# Patient Record
Sex: Male | Born: 1990 | Race: Black or African American | Hispanic: No | Marital: Single | Smoking: Never smoker
Health system: Southern US, Community
[De-identification: ages and names within clinical notes are randomized; demographics above are authoritative.]

## PROBLEM LIST (undated history)

## (undated) DIAGNOSIS — S0181XA Laceration without foreign body of other part of head, initial encounter: Secondary | ICD-10-CM

## (undated) HISTORY — DX: Laceration without foreign body of other part of head, initial encounter: S01.81XA

---

## 2011-06-24 ENCOUNTER — Emergency Department (HOSPITAL_COMMUNITY): Payer: Self-pay

## 2011-06-24 ENCOUNTER — Emergency Department (HOSPITAL_COMMUNITY)
Admission: EM | Admit: 2011-06-24 | Discharge: 2011-06-24 | Disposition: A | Discharge status: Hospice | Payer: Self-pay | Attending: Emergency Medicine | Admitting: Emergency Medicine

## 2011-06-24 ENCOUNTER — Inpatient Hospital Stay (HOSPITAL_COMMUNITY)
Admission: EM | Admit: 2011-06-24 | Discharge: 2011-06-27 | DRG: 699 | Disposition: A | Discharge status: Home / Self Care | Payer: PRIVATE HEALTH INSURANCE | Source: Ambulatory Visit | Attending: General Surgery | Admitting: General Surgery

## 2011-06-24 DIAGNOSIS — S0180XA Unspecified open wound of other part of head, initial encounter: Secondary | ICD-10-CM

## 2011-06-24 DIAGNOSIS — S36899A Unspecified injury of other intra-abdominal organs, initial encounter: Secondary | ICD-10-CM | POA: Insufficient documentation

## 2011-06-24 DIAGNOSIS — N289 Disorder of kidney and ureter, unspecified: Secondary | ICD-10-CM | POA: Diagnosis present

## 2011-06-24 DIAGNOSIS — Y9302 Activity, running: Secondary | ICD-10-CM | POA: Insufficient documentation

## 2011-06-24 DIAGNOSIS — Y9210 Unspecified residential institution as the place of occurrence of the external cause: Secondary | ICD-10-CM | POA: Diagnosis present

## 2011-06-24 DIAGNOSIS — S37009A Unspecified injury of unspecified kidney, initial encounter: Secondary | ICD-10-CM | POA: Insufficient documentation

## 2011-06-24 DIAGNOSIS — W108XXA Fall (on) (from) other stairs and steps, initial encounter: Secondary | ICD-10-CM | POA: Diagnosis present

## 2011-06-24 DIAGNOSIS — W2209XA Striking against other stationary object, initial encounter: Secondary | ICD-10-CM | POA: Diagnosis present

## 2011-06-24 DIAGNOSIS — D62 Acute posthemorrhagic anemia: Secondary | ICD-10-CM | POA: Diagnosis present

## 2011-06-24 DIAGNOSIS — S37039A Laceration of unspecified kidney, unspecified degree, initial encounter: Principal | ICD-10-CM | POA: Diagnosis present

## 2011-06-24 LAB — LACTIC ACID, PLASMA: Lactic Acid, Venous: 4 mmol/L — ABNORMAL HIGH (ref 0.5–2.2)

## 2011-06-24 LAB — RAPID URINE DRUG SCREEN, HOSP PERFORMED
Amphetamines: NOT DETECTED
Benzodiazepines: NOT DETECTED
Tetrahydrocannabinol: NOT DETECTED

## 2011-06-24 LAB — CBC
HCT: 41.7 % (ref 39.0–52.0)
HCT: 36.6 % — ABNORMAL LOW (ref 39.0–52.0)
HCT: 35.4 % — ABNORMAL LOW (ref 39.0–52.0)
MCH: 28.9 pg (ref 26.0–34.0)
MCH: 28.8 pg (ref 26.0–34.0)
MCHC: 34.5 g/dL (ref 30.0–36.0)
MCHC: 34.4 g/dL (ref 30.0–36.0)
MCHC: 34.5 g/dL (ref 30.0–36.0)
MCV: 83.7 fL (ref 78.0–100.0)
MCV: 83.1 fL (ref 78.0–100.0)
Platelets: 256 10*3/uL (ref 150–400)
RDW: 12.5 % (ref 11.5–15.5)
RDW: 12.5 % (ref 11.5–15.5)
RDW: 12.6 % (ref 11.5–15.5)
WBC: 11.8 10*3/uL — ABNORMAL HIGH (ref 4.0–10.5)

## 2011-06-24 LAB — ETHANOL: Alcohol, Ethyl (B): 46 mg/dL — ABNORMAL HIGH (ref 0–11)

## 2011-06-24 LAB — BASIC METABOLIC PANEL
BUN: 12 mg/dL (ref 6–23)
Calcium: 9.6 mg/dL (ref 8.4–10.5)
Creatinine, Ser: 1.46 mg/dL — ABNORMAL HIGH (ref 0.50–1.35)
GFR calc Af Amer: 79 mL/min — ABNORMAL LOW (ref >90)
GFR calc non Af Amer: 68 mL/min — ABNORMAL LOW (ref >90)
Glucose, Bld: 112 mg/dL — ABNORMAL HIGH (ref 70–99)
Potassium: 3.7 mEq/L (ref 3.5–5.1)

## 2011-06-24 LAB — URINALYSIS, ROUTINE W REFLEX MICROSCOPIC
Ketones, ur: 15 mg/dL — AB
Nitrite: NEGATIVE
Protein, ur: >300 mg/dL — AB
Urobilinogen, UA: 0.2 mg/dL (ref 0.0–1.0)

## 2011-06-24 LAB — DIFFERENTIAL
Basophils Absolute: 0 10*3/uL (ref 0.0–0.1)
Eosinophils Relative: 0 % (ref 0–5)
Lymphocytes Relative: 6 % — ABNORMAL LOW (ref 12–46)
Monocytes Absolute: 1.5 10*3/uL — ABNORMAL HIGH (ref 0.1–1.0)
Monocytes Relative: 8 % (ref 3–12)

## 2011-06-24 LAB — MRSA PCR SCREENING: MRSA by PCR: NEGATIVE

## 2011-06-24 MED ORDER — IOHEXOL 300 MG/ML  SOLN
100.0000 mL | Freq: Once | INTRAMUSCULAR | Status: AC | PRN
  Administered 2011-06-24: 100 mL via INTRAVENOUS

## 2011-06-24 NOTE — H&P (Signed)
NAME:  [name], [name]             ACCOUNT NO.:  [id number]  MEDICAL RECORD NO.:  [id number]  LOCATION:  [facility]                         FACILITY:  [facility]  PHYSICIAN:  Almond Lint, MD       DATE OF BIRTH:  02-16-1991  DATE OF ADMISSION:  06/24/2011 DATE OF DISCHARGE:                             HISTORY & PHYSICAL   CHIEF COMPLAINT:  Fall with left kidney laceration.  HISTORY OF PRESENT ILLNESS:  Mr. [name] [name] is a 20 year old male who states that he tripped and fell down the stairs in his dorm.  There was some comment that this may involve some hazing, but the patient denies this.  He had a small chin laceration as well that was repaired over [employer].  He was taken there initially and was found to have a left kidney laceration.  He was transferred to [facility] for admission and care.  The patient denies loss of consciousness. He denies nausea or vomiting.  He complains of left upper arm pain and abdominal pain.  He describes the pain as severe when he got here, but after receiving medication the pain level came down appropriately from an 8 down to a 1.  PAST MEDICAL HISTORY:  Negative.  PAST SURGICAL HISTORY:  Negative.  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 Lives in a dorm.  He is here in the emergency department with this parents.  ALLERGIES:  None.  MEDICATIONS:  None.  REVIEW OF SYSTEMS:  Normal other than the HPI.  PHYSICAL EXAMINATION:  VITAL SIGNS:  Temperature 99.1, pulse 69, respiratory rate 20, blood pressure 130/67, and sats 99% on room air. SKIN:  He has some faint bruising over his left flank and a 1-cm left chin laceration. HEAD:  Normocephalic, atraumatic, and nontender. EYES:  Pupils are equal, round, and reactive to light.  Sclerae are anicteric.  Extraocular movements are intact. EARS:  There are no signs of external trauma to the pinnae and no blood in the auditory canal.  The tympanic membranes are obscured  bilaterally by cerumen. FACE:  Midface is stable.  There is a small 1-cm chin laceration repaired with interrupted sutures on the left.  There are no other signs of trauma to the face. NECK:  Nontender and he has good range of motion. LUNGS:  Clear bilaterally. CHEST:  Nontender.  He is very muscular. HEART:  Regular rate and rhythm.  No murmurs. ABDOMEN:  Soft and nondistended.  The left thigh is tender. PELVIS:  Stable and nontender. EXTREMITIES:  The bilateral lower extremities are non-injured, but there is pain in the left upper arm and the bicipital groove.  There is no bony tenderness. BACK:  Nontender other than the flank. NEURO:  No gross motor or sensory deficits.  LABS:  Sodium 137, potassium 3.7, chloride 99, CO2 of 23, BUN 12, creatinine 1.46, and glucose 112,000.  UA shows a large amount of blood, moderate bilirubin, 300 protein, positive LE and leukocyte.  White count 18.9, hemoglobin 14.4, hematocrit 41.7, and platelet count 337,000. Alcohol level is 46.  Lactate is 4.  Chest x-ray is negative for trauma. CT of the head and C-spine are negative  for trauma.  CT of the abdomen and pelvis shows complex left kidney laceration with a hematoma.  Dr. Sherron Monday was contacted of urology by Dr. Dierdre Highman over at [facility]. He stated he would see the patient over at [facility].  ASSESSMENT/PLAN:  This is a 20 year old male status post fall with chin laceration and complex left kidney laceration.  I will admit him to the ICU versus step-down with intermittent CBCs.  This certainly is a large laceration and I am unable to tell if there is any urine extravasation or not.  His creatinine is elevated, but he is also a very muscular guy. We will control his pain with intermittent IV medication.  He will remain n.p.o. until Dr. Sherron Monday has weighed in on his care.  I am also going to leave him on bedrest at this time.     Almond Lint, MD     FB/MEDQ  D:  06/24/2011  T:   06/24/2011  Job:  [id number]  Electronically Signed by Almond Lint MD on 06/24/2011 05:13:54 PM

## 2011-06-25 LAB — CBC
HCT: 34.1 % — ABNORMAL LOW (ref 39.0–52.0)
Hemoglobin: 11.3 g/dL — ABNORMAL LOW (ref 13.0–17.0)
Hemoglobin: 10.9 g/dL — ABNORMAL LOW (ref 13.0–17.0)
MCH: 28.3 pg (ref 26.0–34.0)
MCHC: 33.1 g/dL (ref 30.0–36.0)
MCV: 83.8 fL (ref 78.0–100.0)
MCV: 84.1 fL (ref 78.0–100.0)
Platelets: 227 10*3/uL (ref 150–400)
Platelets: 272 10*3/uL (ref 150–400)
RBC: 3.78 MIL/uL — ABNORMAL LOW (ref 4.22–5.81)
RBC: 4.05 MIL/uL — ABNORMAL LOW (ref 4.22–5.81)
RBC: 4.2 MIL/uL — ABNORMAL LOW (ref 4.22–5.81)
RDW: 12.5 % (ref 11.5–15.5)
WBC: 9.1 10*3/uL (ref 4.0–10.5)
WBC: 8.4 10*3/uL (ref 4.0–10.5)

## 2011-06-25 LAB — BASIC METABOLIC PANEL
CO2: 27 mEq/L (ref 19–32)
Calcium: 8.9 mg/dL (ref 8.4–10.5)
Creatinine, Ser: 1.45 mg/dL — ABNORMAL HIGH (ref 0.50–1.35)
GFR calc Af Amer: 80 mL/min — ABNORMAL LOW (ref >90)
GFR calc non Af Amer: 69 mL/min — ABNORMAL LOW (ref >90)
Sodium: 133 mEq/L — ABNORMAL LOW (ref 135–145)

## 2011-06-25 LAB — URINALYSIS, ROUTINE W REFLEX MICROSCOPIC
Ketones, ur: NEGATIVE mg/dL
Leukocytes, UA: NEGATIVE
Nitrite: NEGATIVE
Protein, ur: NEGATIVE mg/dL
Urobilinogen, UA: 0.2 mg/dL (ref 0.0–1.0)

## 2011-06-25 LAB — URINE MICROSCOPIC-ADD ON

## 2011-06-25 LAB — GLUCOSE, CAPILLARY

## 2011-06-27 LAB — CBC
HCT: 32.3 % — ABNORMAL LOW (ref 39.0–52.0)
Hemoglobin: 10.8 g/dL — ABNORMAL LOW (ref 13.0–17.0)
MCHC: 33.4 g/dL (ref 30.0–36.0)
MCV: 84.6 fL (ref 78.0–100.0)

## 2011-07-02 ENCOUNTER — Telehealth: Payer: Self-pay | Admitting: Orthopedic Surgery

## 2011-07-02 NOTE — Telephone Encounter (Signed)
Patient noted he was still urinating blood and clots, although it wasn't much. I told him to make an appointment with [facility] and provided the number. He also said he had some bleeding from one side of his nose intermittently. I said we would evaluate it at his appointment on Thursday.

## 2011-07-05 ENCOUNTER — Encounter (INDEPENDENT_AMBULATORY_CARE_PROVIDER_SITE_OTHER): Payer: Self-pay

## 2011-07-05 ENCOUNTER — Ambulatory Visit (INDEPENDENT_AMBULATORY_CARE_PROVIDER_SITE_OTHER): Payer: PRIVATE HEALTH INSURANCE | Admitting: Physician Assistant

## 2011-07-05 DIAGNOSIS — S0180XA Unspecified open wound of other part of head, initial encounter: Secondary | ICD-10-CM

## 2011-07-05 DIAGNOSIS — W108XXA Fall (on) (from) other stairs and steps, initial encounter: Secondary | ICD-10-CM | POA: Insufficient documentation

## 2011-07-05 DIAGNOSIS — S37039A Laceration of unspecified kidney, unspecified degree, initial encounter: Secondary | ICD-10-CM

## 2011-07-05 DIAGNOSIS — S37059A Moderate laceration of unspecified kidney, initial encounter: Secondary | ICD-10-CM | POA: Insufficient documentation

## 2011-07-05 DIAGNOSIS — S0181XA Laceration without foreign body of other part of head, initial encounter: Secondary | ICD-10-CM

## 2011-07-05 NOTE — Patient Instructions (Signed)
Use sunscreen to avoid worsening of chin laceration scar over time . Follow up with Urology as instructed Follow up with Trauma Service as needed , call for questions [phone]

## 2011-07-05 NOTE — Progress Notes (Signed)
Subjective:     Patient ID: [name] [name], male   DOB: 1991-08-20, 20 y.o.   MRN: [id number]  HPI [name] is seen in follow up for suture removal from chin laceration he sustained in a fall in the dorms. He also had a kidney laceration and is to follow up with urology concerning this.   Review of Systems     Objective:   Physical Exam Ambulates without antalgia Chin laceration is healing well and sutures were removed without difficulty    Assessment:     Patient Active Problem List  Diagnoses  . Fall down stairs  . Chin laceration  . Moderate laceration of kidney       Plan:     follow up the trauma service prn See urology as instructed

## 2013-04-18 ENCOUNTER — Emergency Department (HOSPITAL_COMMUNITY)
Admission: EM | Admit: 2013-04-18 | Discharge: 2013-04-18 | Disposition: A | Discharge status: Home / Self Care | Payer: Worker's Compensation | Attending: Emergency Medicine | Admitting: Emergency Medicine

## 2013-04-18 ENCOUNTER — Emergency Department (HOSPITAL_COMMUNITY): Payer: Worker's Compensation

## 2013-04-18 ENCOUNTER — Encounter (HOSPITAL_COMMUNITY): Payer: Self-pay | Admitting: Emergency Medicine

## 2013-04-18 DIAGNOSIS — S6990XA Unspecified injury of unspecified wrist, hand and finger(s), initial encounter: Secondary | ICD-10-CM | POA: Insufficient documentation

## 2013-04-18 DIAGNOSIS — W19XXXA Unspecified fall, initial encounter: Secondary | ICD-10-CM

## 2013-04-18 DIAGNOSIS — M79642 Pain in left hand: Secondary | ICD-10-CM

## 2013-04-18 DIAGNOSIS — W11XXXA Fall on and from ladder, initial encounter: Secondary | ICD-10-CM | POA: Insufficient documentation

## 2013-04-18 DIAGNOSIS — Y929 Unspecified place or not applicable: Secondary | ICD-10-CM | POA: Insufficient documentation

## 2013-04-18 DIAGNOSIS — Y990 Civilian activity done for income or pay: Secondary | ICD-10-CM | POA: Insufficient documentation

## 2013-04-18 DIAGNOSIS — Y9389 Activity, other specified: Secondary | ICD-10-CM | POA: Insufficient documentation

## 2013-04-18 MED ORDER — TRAMADOL HCL 50 MG PO TABS: 50.0000 mg | ORAL_TABLET | Freq: Four times a day (QID) | ORAL | Status: DC | PRN

## 2013-04-18 NOTE — ED Notes (Signed)
Pt states that he was work, fell off a ladder from about 7 ft and broke his fall on his left hand.  Denies LOC.  Denies hitting head.  States that the only thing that is hurting him is his hand.

## 2013-04-18 NOTE — ED Provider Notes (Signed)
CSN: [id number]     Arrival date & time 04/18/13  1737 History  This chart was scribed for non-physician practitioner, Magnus Sinning, PA-C working with Junius Argyle, MD by Ronal Fear, ED scribe. This patient was seen in room WTR7/WTR7 and the patient's care was started at 5:59 PM.   Chief Complaint  Patient presents with  . Fall  . Hand Pain   The history is provided by the patient. No language interpreter was used.   HPI Comments: [name] [name] is a 22 y.o. male who presents to the Emergency Department complaining of sudden onset, constant left hand pain that started about 6 hours ago when he fell 7 feet off of a latter. Pt states he hit a refrigerator and landed on his left hand and left buttock. He denies hitting his head or LOC. Pt denies HA, nausea, emesis, back pain, buttock pain, neck pain and visual disturbance as associated symptoms. He has not taken anything for the pain yet.    Past Medical History  Diagnosis Date  . Laceration of chin    No past surgical history on file. No family history on file. History  Substance Use Topics  . Smoking status: Never Smoker   . Smokeless tobacco: Never Used  . Alcohol Use: No    Review of Systems  Musculoskeletal: Positive for arthralgias.  Neurological: Negative for syncope.  All other systems reviewed and are negative.    Allergies  Review of patient's allergies indicates no known allergies.  Home Medications  No current outpatient prescriptions on file.  BP 144/78  Pulse 54  Temp(Src) 98.6 F (37 C) (Oral)  Resp 14  SpO2 100%  Physical Exam  Nursing note and vitals reviewed. Constitutional: He is oriented to person, place, and time. He appears well-developed and well-nourished. No distress.  HENT:  Head: Normocephalic and atraumatic.  Eyes: Conjunctivae and EOM are normal. Pupils are equal, round, and reactive to light.  Neck: Normal range of motion. Neck supple. No tracheal deviation present.   Cardiovascular: Normal rate, regular rhythm and normal heart sounds.   Pulmonary/Chest: Effort normal. No respiratory distress. He exhibits no tenderness.  Musculoskeletal: Normal range of motion.  No tenderness to palpation of cervical, thoracic, or lumbar spine. No deformities or step offs. Full ROM of left shoulder and elbow without pain. No tenderness to palpation of left wrist. Tenderness to palpation of first metacarpal of left hand. No swelling or bruising. Skin is intact. Good radial pulse. Full ROM of hips, knees and ankles bilaterally. Normal ambulation. Strength 5/5.   Neurological: He is alert and oriented to person, place, and time. He has normal strength. No cranial nerve deficit or sensory deficit. Gait normal.  Sensation of all fingers intact. Cranial nerves intact.   Skin: Skin is warm, dry and intact. No bruising and no ecchymosis noted.  Psychiatric: He has a normal mood and affect. His behavior is normal.    ED Course  DIAGNOSTIC STUDIES: Oxygen Saturation is 100% on RA, normal by my interpretation.    COORDINATION OF CARE: 6:51 PM-Discussed treatment plan which includes thumb spica splint, antiinflammatories, and ice with pt at bedside and pt agreed to plan.   Procedures (including critical care time)  Labs Reviewed - No data to display Dg Hand Complete Left  04/18/2013   CLINICAL DATA:  Left hand and thumb pain following a fall.  EXAM: LEFT HAND - COMPLETE 3+ VIEW  COMPARISON:  None.  FINDINGS: There is no evidence of fracture  or dislocation. There is no evidence of arthropathy or other focal bone abnormality. Soft tissues are unremarkable  IMPRESSION: Normal examination.   Electronically Signed   By: Gordan Payment   On: 04/18/2013 18:15   No diagnosis found.  MDM  Patient presenting with left hand pain after a fall.  Patient denies neck pain, back pain, or headache.  Ambulating without difficulty.  No bruising on exam.  Xray negative.  Patient neurovascularly intact.   Patient given thumb spica splint.  Patient stable for discharge.  I personally performed the services described in this documentation, which was scribed in my presence. The recorded information has been reviewed and is accurate.   Pascal Lux Juana Di­az, PA-C 04/20/13 2106

## 2013-04-21 NOTE — ED Provider Notes (Signed)
Medical screening examination/treatment/procedure(s) were performed by non-physician practitioner and as supervising physician I was immediately available for consultation/collaboration.   Teague Goynes S Khala Tarte, MD 04/21/13 1308 

## 2013-05-28 ENCOUNTER — Emergency Department (HOSPITAL_COMMUNITY)
Admission: EM | Admit: 2013-05-28 | Discharge: 2013-05-28 | Disposition: A | Discharge status: Home / Self Care | Payer: BC Managed Care – PPO | Source: Home / Self Care | Attending: Emergency Medicine | Admitting: Emergency Medicine

## 2013-05-28 ENCOUNTER — Encounter (HOSPITAL_COMMUNITY): Payer: Self-pay | Admitting: *Deleted

## 2013-05-28 ENCOUNTER — Emergency Department (INDEPENDENT_AMBULATORY_CARE_PROVIDER_SITE_OTHER): Payer: BC Managed Care – PPO

## 2013-05-28 DIAGNOSIS — S93409A Sprain of unspecified ligament of unspecified ankle, initial encounter: Secondary | ICD-10-CM

## 2013-05-28 DIAGNOSIS — S93402A Sprain of unspecified ligament of left ankle, initial encounter: Secondary | ICD-10-CM

## 2013-05-28 MED ORDER — HYDROCODONE-ACETAMINOPHEN 5-325 MG PO TABS: ORAL_TABLET | ORAL | Status: DC

## 2013-05-28 NOTE — ED Notes (Signed)
Pt       Reports      He    Went  Up  From   A  Rebound and  Landed  On his  l  Foot   /  Ankle    He  Has  Pain  And   Swelling  Noted      Affected  Foot

## 2013-05-28 NOTE — ED Provider Notes (Signed)
Chief Complaint:   Chief Complaint  Patient presents with  . Ankle Pain    History of Present Illness:   [name] [name] is a 22 year old male who was playing basketball yesterday when he came down on his left ankle, sustaining an inversion type injury. He did not hear a pop, but his ankle swelled up immediately and he had pain with ambulation. He is able to bear some weight. It hurts over the lateral malleolus. There is no numbness or tingling.  Review of Systems:  Other than noted above, the patient denies any of the following symptoms: Systemic:  No fevers, chills, sweats, or aches.   Musculoskeletal:  No joint pain, arthritis, bursitis, swelling, back pain, or neck pain. Neurological:  No muscular weakness or paresthesias.  PMFSH:  Past medical history, family history, social history, meds, and allergies were reviewed.   Physical Exam:   Vital signs:  There were no vitals taken for this visit. Gen:  Alert and oriented times 3.  In no distress. Musculoskeletal: Exam of the ankle reveals there is marketed swelling and tenderness to palpation of the lateral malleolus. The ankle has a limited range of motion with pain. Anterior drawer sign was negative.  Talar tilt negative. Squeeze test negative. Achilles tendon, peroneal tendon, and tibialis posterior were intact. Otherwise, all joints had a full a ROM with no swelling, bruising or deformity.  No edema, pulses full. Extremities were warm and pink.  Capillary refill was brisk.  Skin:  Clear, warm and dry.  No rash. Neuro:  Alert and oriented times 3.  Muscle strength was normal.  Sensation was intact to light touch.   Radiology:  Dg Ankle Complete Left  05/28/2013   CLINICAL DATA:  Pain post trauma  EXAM: LEFT ANKLE COMPLETE - 3+ VIEW  COMPARISON:  None  FINDINGS: There is soft tissue swelling laterally. No acute fracture or effusion. Ankle mortise appears intact. A small focus of calcification is seen between the medial malleolus and talus  which appears well corticated and could represent residua of old trauma.  IMPRESSION: Soft tissue swelling laterally. No apparent acute fracture. Mortise intact.   Electronically Signed   By: Bretta Bang   On: 05/28/2013 12:42   I reviewed the images independently and personally and concur with the radiologist's findings.  Course in Urgent Care Center:   Placed in an ASO brace and given crutches.  Assessment:  The encounter diagnosis was Ankle sprain, left, initial encounter.  Plan:   1.  Meds:  The following meds were prescribed:   Discharge Medication List as of 05/28/2013 12:55 PM    START taking these medications   Details  HYDROcodone-acetaminophen (NORCO/VICODIN) 5-325 MG per tablet 1 to 2 tabs every 4 to 6 hours as needed for pain., Print        2.  Patient Education/Counseling:  The patient was given appropriate handouts, self care instructions, including rest and activity, elevation, application of ice and compression, and instructed in symptomatic relief.  Given exercises to start after 3 days.  3.  Follow up:  The patient was told to follow up if no better in 3 to 4 days, if becoming worse in any way, and given some red flag symptoms such as worsening pain which would prompt immediate return.  Follow up with Dr. Annell Greening if no better in 2 weeks.     Reuben Likes, MD 05/28/13 [phone]

## 2014-07-23 IMAGING — CR DG ANKLE COMPLETE 3+V*L*
3 series · 3 of 3 positions shown · non-contrast
Comparison: None

CLINICAL DATA: Pain post trauma

EXAM:
LEFT ANKLE COMPLETE - 3+ VIEW

[view not recorded (1 of 3)]
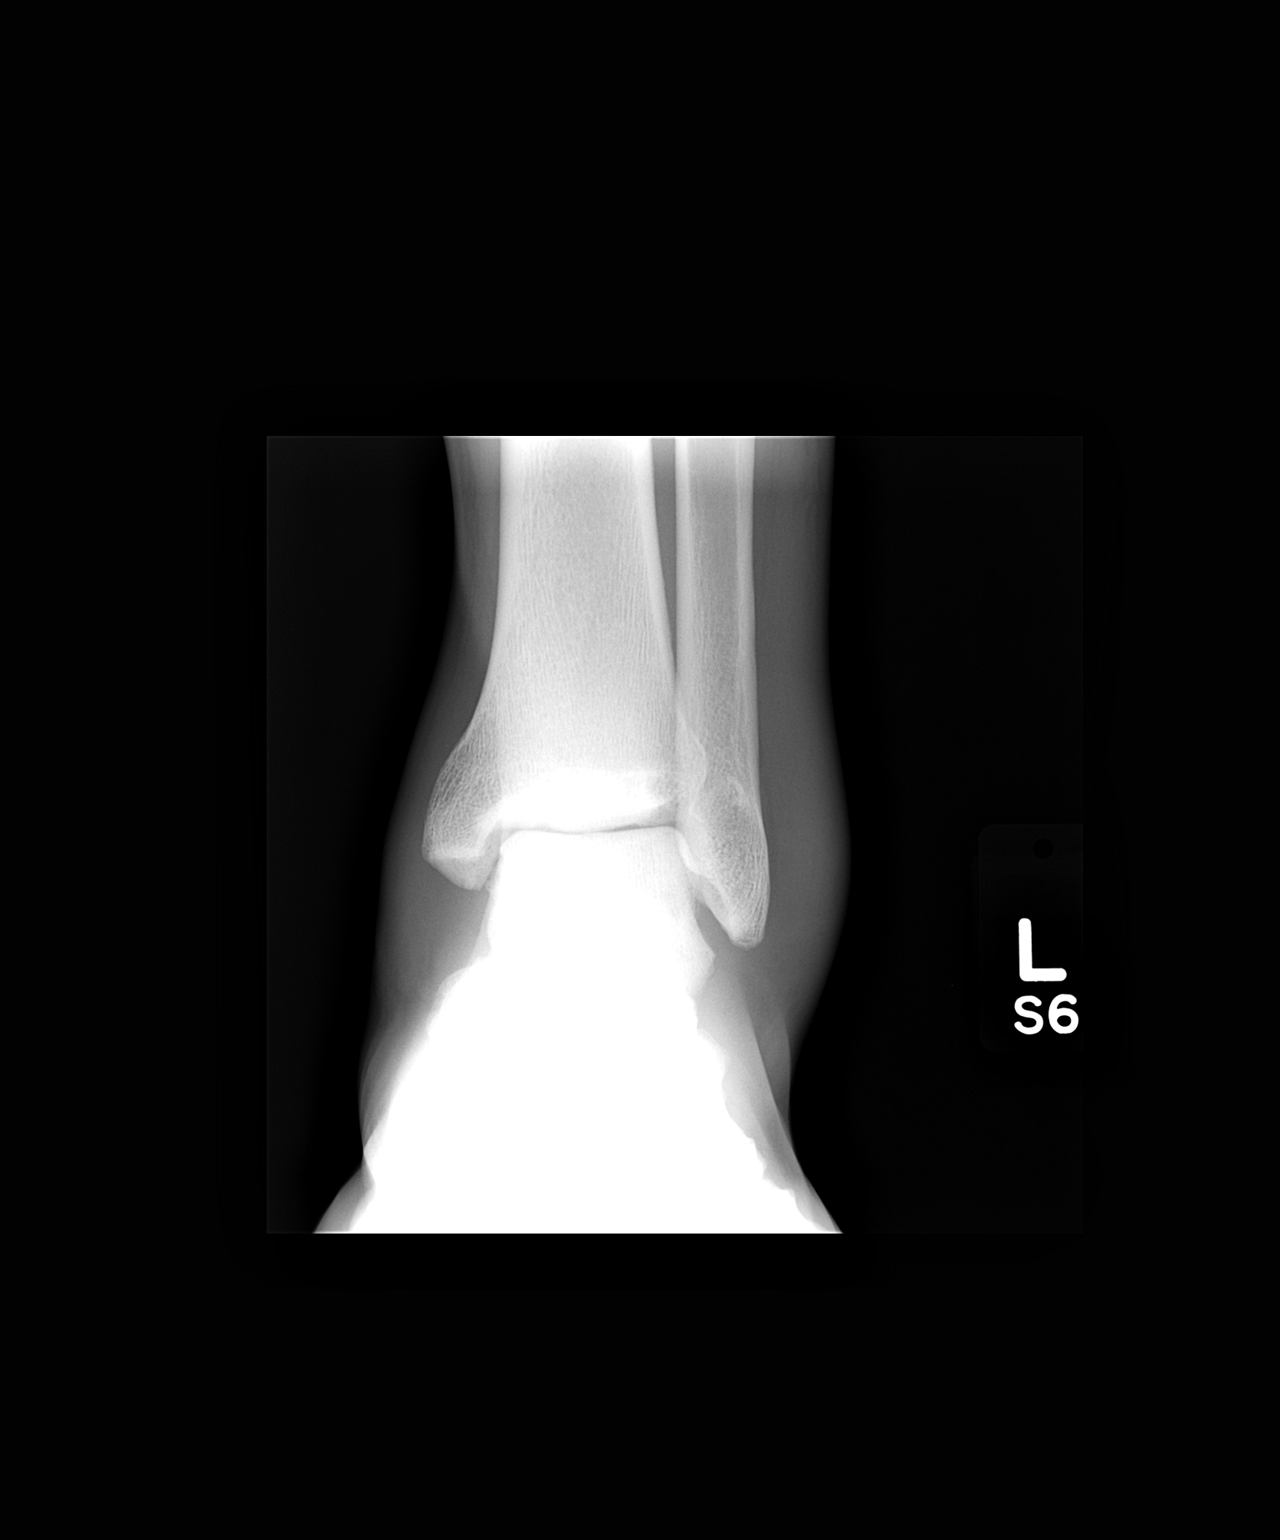

[view not recorded (2 of 3)]
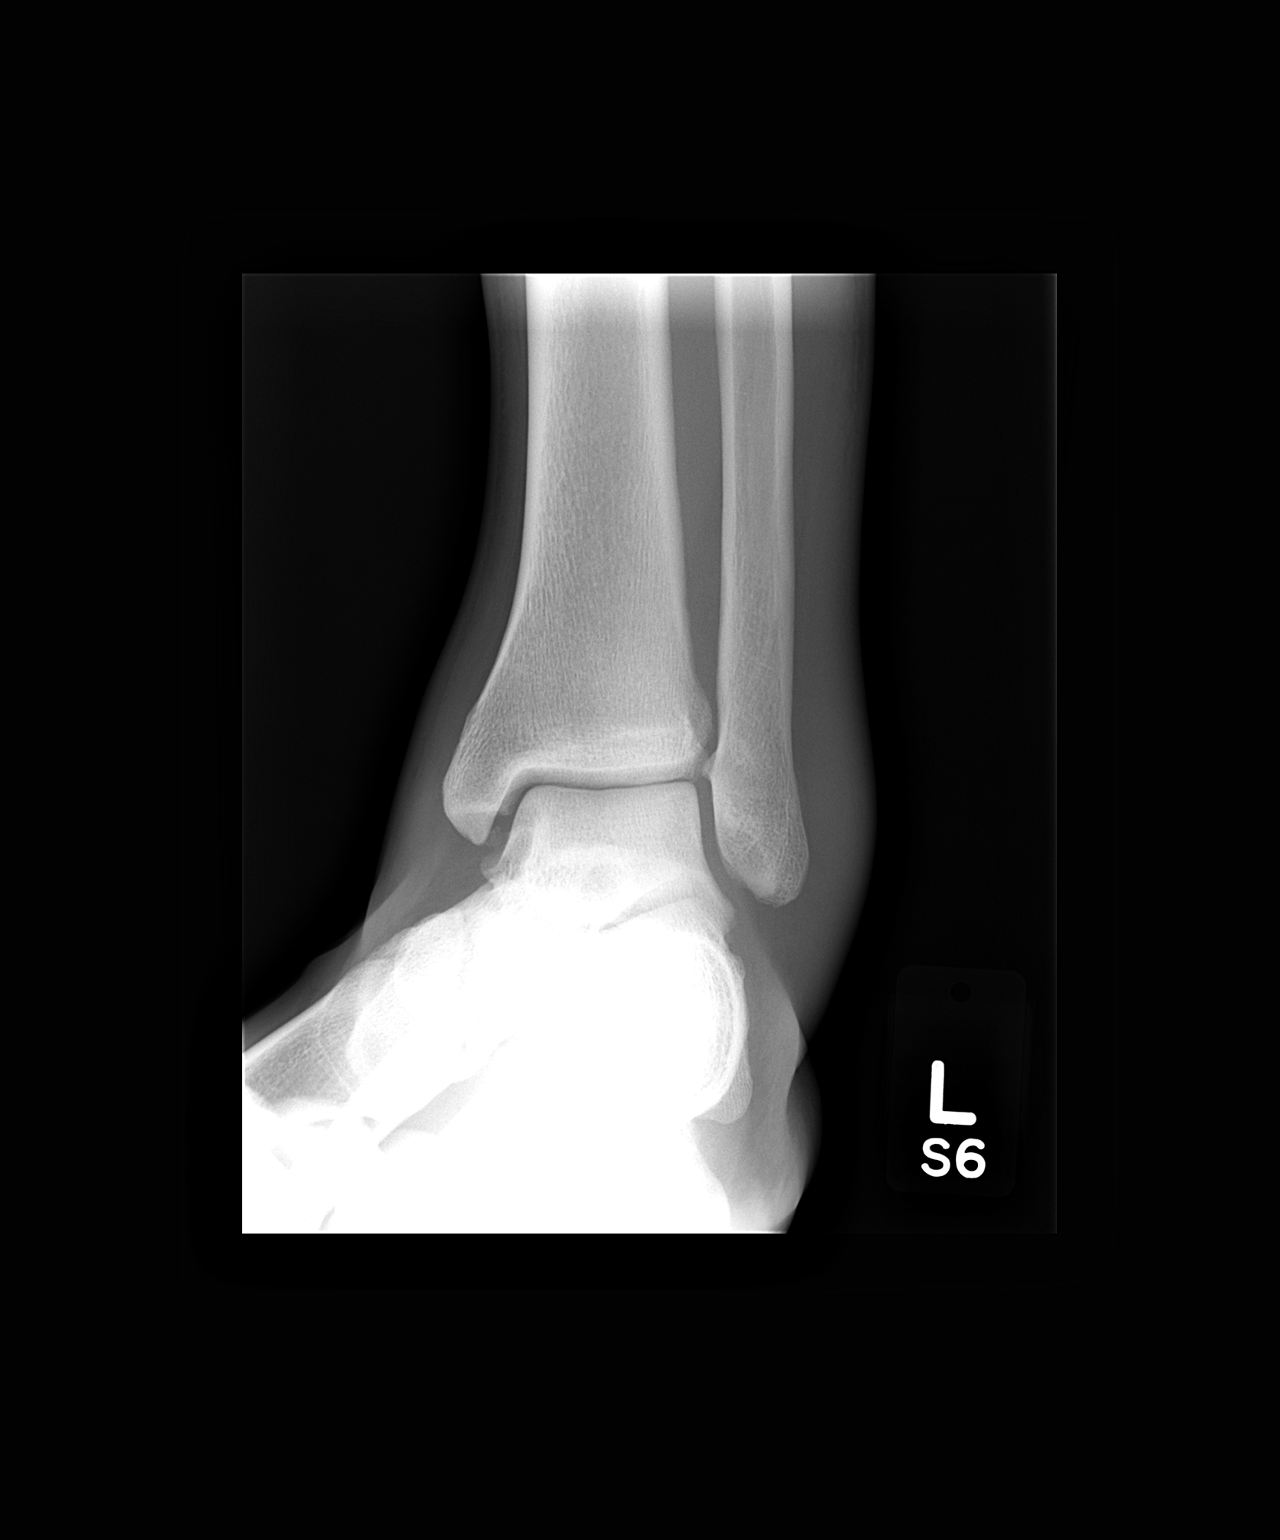

[view not recorded (3 of 3)]
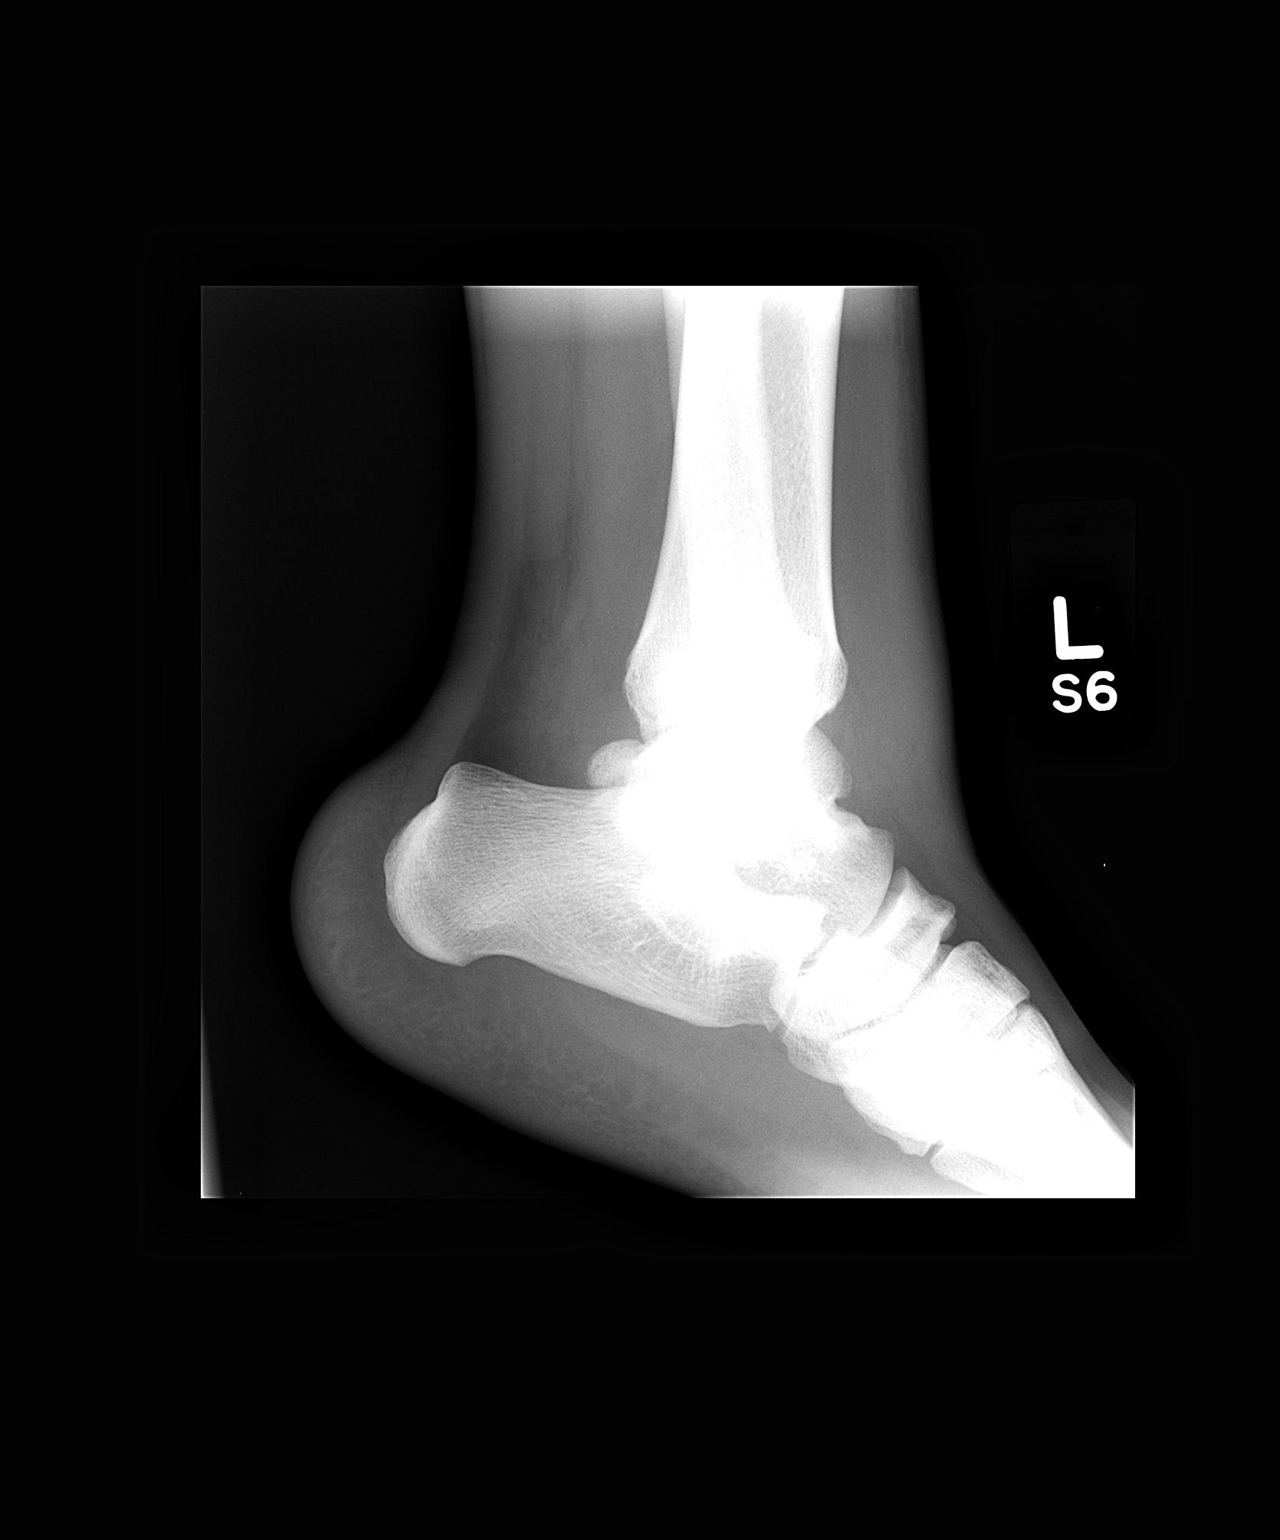

[3 of 3 positions shown; findings below may reference images not displayed]

FINDINGS: There is soft tissue swelling laterally. No acute fracture or
effusion. Ankle mortise appears intact. A small focus of
calcification is seen between the medial malleolus and talus which
appears well corticated and could represent residua of old trauma.
IMPRESSION: Soft tissue swelling laterally. No apparent acute fracture. Mortise
intact.

## 2016-05-28 ENCOUNTER — Emergency Department (INDEPENDENT_AMBULATORY_CARE_PROVIDER_SITE_OTHER)
Admission: EM | Admit: 2016-05-28 | Discharge: 2016-05-28 | Disposition: A | Discharge status: Home / Self Care | Payer: PRIVATE HEALTH INSURANCE | Source: Home / Self Care | Attending: Family Medicine | Admitting: Family Medicine

## 2016-05-28 ENCOUNTER — Encounter: Payer: Self-pay | Admitting: *Deleted

## 2016-05-28 DIAGNOSIS — R197 Diarrhea, unspecified: Secondary | ICD-10-CM | POA: Diagnosis not present

## 2016-05-28 DIAGNOSIS — R5383 Other fatigue: Secondary | ICD-10-CM

## 2016-05-28 DIAGNOSIS — R112 Nausea with vomiting, unspecified: Secondary | ICD-10-CM | POA: Diagnosis not present

## 2016-05-28 NOTE — ED Provider Notes (Signed)
CSN: [id number]     Arrival date & time 05/28/16  1146 History   First MD Initiated Contact with Patient 05/28/16 1222     Chief Complaint  Patient presents with  . Fatigue   (Consider location/radiation/quality/duration/timing/severity/associated sxs/prior Treatment) HPI  [name] [name] is a 25 y.o. male presenting to UC with c/o n/v/d for 3 days and associated fatigue and loss of appetite. He believes he got sick from another co-worker with similar symptoms. He reports having 2-3 episodes of vomiting and diarrhea, which both resolved on their own 2-3 days ago.  He still has mild fatigue but states he feels ready to go back to work. 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     Past Medical History:  Diagnosis Date  . Laceration of chin    History reviewed. No pertinent surgical history. History reviewed. No pertinent family history. Social History  Substance Use Topics  . Smoking status: Never Smoker  . Smokeless tobacco: Never Used  . Alcohol use No    Review of Systems  Constitutional: Positive for fatigue. Negative for chills and fever.  Gastrointestinal: Positive for abdominal pain, diarrhea, nausea and vomiting. Negative for blood in stool.  Neurological: Negative for dizziness, weakness, light-headedness and headaches.    Allergies  Other  Home Medications   Prior to Admission medications   Not on File   Meds Ordered and Administered this Visit  Medications - No data to display  BP 128/83 (BP Location: Left Arm)   Pulse 65   Temp 98.4 F (36.9 C) (Oral)   Resp 14   Wt 179 lb (81.2 kg)   SpO2 99%   BMI 27.22 kg/m  No data found.   Physical Exam  Constitutional: He appears well-developed and well-nourished.  HENT:  Head: Normocephalic and atraumatic.  Mouth/Throat: Oropharynx is clear and moist.  Eyes: Conjunctivae are normal. No scleral icterus.  Neck: Normal range of  motion. Neck supple.  Cardiovascular: Normal rate, regular rhythm and normal heart sounds.   Pulmonary/Chest: Effort normal and breath sounds normal. No respiratory distress. He has no wheezes. He has no rales.  Abdominal: Soft. Bowel sounds are normal. He exhibits no distension and no mass. There is no tenderness. There is no rebound and no guarding.  Musculoskeletal: Normal range of motion.  Neurological: He is alert.  Skin: Skin is warm and dry.  Nursing note and vitals reviewed.   Urgent Care Course   Clinical Course    Procedures (including critical care time)  Labs Review Labs Reviewed - No data to display  Imaging Review No results found.   MDM   1. Nausea vomiting and diarrhea   2. Other fatigue    Pt requesting a work note after being out last week due to n/v/d. Pt states he feels better now but still fatigued.   Declined prescription for nausea medication.  Work note provided to return tomorrow. Encouraged fluids, rest, and good hand washing. F/u with PCP if symptoms return. Patient verbalized understanding and agreement with treatment plan.     Junius Finnerrin O'Malley, PA-C 05/28/16 1422

## 2016-05-28 NOTE — ED Triage Notes (Signed)
Pt reports having vomiting and fever for 3 days that ended 3 days ago. Still c/o some fatigue and appetite loss.
